# Patient Record
Sex: Male | Born: 1960
Health system: Southern US, Community
[De-identification: ages and names within clinical notes are randomized; demographics above are authoritative.]

## PROBLEM LIST (undated history)

## (undated) DIAGNOSIS — J449 Chronic obstructive pulmonary disease, unspecified: Secondary | ICD-10-CM

## (undated) DIAGNOSIS — M1990 Unspecified osteoarthritis, unspecified site: Secondary | ICD-10-CM

## (undated) DIAGNOSIS — F1010 Alcohol abuse, uncomplicated: Secondary | ICD-10-CM

## (undated) HISTORY — PX: OTHER SURGICAL HISTORY: SHX169

---

## 2019-02-01 ENCOUNTER — Emergency Department (HOSPITAL_COMMUNITY): Payer: Self-pay

## 2019-02-01 ENCOUNTER — Encounter (HOSPITAL_COMMUNITY): Payer: Self-pay | Admitting: Emergency Medicine

## 2019-02-01 ENCOUNTER — Inpatient Hospital Stay (HOSPITAL_COMMUNITY)
Admission: EM | Admit: 2019-02-01 | Discharge: 2019-02-04 | DRG: 200 | Disposition: A | Discharge status: Home / Self Care | Payer: Self-pay | Attending: General Surgery | Admitting: General Surgery

## 2019-02-01 ENCOUNTER — Other Ambulatory Visit: Payer: Self-pay

## 2019-02-01 DIAGNOSIS — M1990 Unspecified osteoarthritis, unspecified site: Secondary | ICD-10-CM | POA: Diagnosis present

## 2019-02-01 DIAGNOSIS — Y92524 Gas station as the place of occurrence of the external cause: Secondary | ICD-10-CM

## 2019-02-01 DIAGNOSIS — Z79899 Other long term (current) drug therapy: Secondary | ICD-10-CM

## 2019-02-01 DIAGNOSIS — J342 Deviated nasal septum: Secondary | ICD-10-CM | POA: Diagnosis present

## 2019-02-01 DIAGNOSIS — F10129 Alcohol abuse with intoxication, unspecified: Secondary | ICD-10-CM | POA: Diagnosis present

## 2019-02-01 DIAGNOSIS — Z1159 Encounter for screening for other viral diseases: Secondary | ICD-10-CM

## 2019-02-01 DIAGNOSIS — J90 Pleural effusion, not elsewhere classified: Secondary | ICD-10-CM | POA: Diagnosis present

## 2019-02-01 DIAGNOSIS — F17200 Nicotine dependence, unspecified, uncomplicated: Secondary | ICD-10-CM | POA: Diagnosis present

## 2019-02-01 DIAGNOSIS — S270XXA Traumatic pneumothorax, initial encounter: Principal | ICD-10-CM | POA: Diagnosis present

## 2019-02-01 DIAGNOSIS — J449 Chronic obstructive pulmonary disease, unspecified: Secondary | ICD-10-CM | POA: Diagnosis present

## 2019-02-01 DIAGNOSIS — S299XXA Unspecified injury of thorax, initial encounter: Secondary | ICD-10-CM | POA: Diagnosis present

## 2019-02-01 DIAGNOSIS — J939 Pneumothorax, unspecified: Secondary | ICD-10-CM

## 2019-02-01 DIAGNOSIS — Z23 Encounter for immunization: Secondary | ICD-10-CM

## 2019-02-01 DIAGNOSIS — S022XXA Fracture of nasal bones, initial encounter for closed fracture: Secondary | ICD-10-CM | POA: Diagnosis present

## 2019-02-01 DIAGNOSIS — Z791 Long term (current) use of non-steroidal anti-inflammatories (NSAID): Secondary | ICD-10-CM

## 2019-02-01 DIAGNOSIS — S2241XA Multiple fractures of ribs, right side, initial encounter for closed fracture: Secondary | ICD-10-CM | POA: Diagnosis present

## 2019-02-01 DIAGNOSIS — S060X9A Concussion with loss of consciousness of unspecified duration, initial encounter: Secondary | ICD-10-CM | POA: Diagnosis present

## 2019-02-01 DIAGNOSIS — S0083XA Contusion of other part of head, initial encounter: Secondary | ICD-10-CM | POA: Diagnosis present

## 2019-02-01 DIAGNOSIS — J9811 Atelectasis: Secondary | ICD-10-CM | POA: Diagnosis present

## 2019-02-01 DIAGNOSIS — Z7951 Long term (current) use of inhaled steroids: Secondary | ICD-10-CM

## 2019-02-01 DIAGNOSIS — Z590 Homelessness: Secondary | ICD-10-CM

## 2019-02-01 HISTORY — DX: Alcohol abuse, uncomplicated: F10.10

## 2019-02-01 HISTORY — DX: Chronic obstructive pulmonary disease, unspecified: J44.9

## 2019-02-01 HISTORY — DX: Unspecified osteoarthritis, unspecified site: M19.90

## 2019-02-01 LAB — CBC WITH DIFFERENTIAL/PLATELET
Abs Immature Granulocytes: 0.03 10*3/uL (ref 0.00–0.07)
Basophils Absolute: 0.1 10*3/uL (ref 0.0–0.1)
Basophils Relative: 1 %
Eosinophils Absolute: 0.1 10*3/uL (ref 0.0–0.5)
Eosinophils Relative: 2 %
HCT: 39.4 % (ref 39.0–52.0)
Hemoglobin: 13.6 g/dL (ref 13.0–17.0)
Immature Granulocytes: 0 %
Lymphocytes Relative: 17 %
Lymphs Abs: 1.6 10*3/uL (ref 0.7–4.0)
MCH: 34.1 pg — ABNORMAL HIGH (ref 26.0–34.0)
MCHC: 34.5 g/dL (ref 30.0–36.0)
MCV: 98.7 fL (ref 80.0–100.0)
Monocytes Absolute: 1.1 10*3/uL — ABNORMAL HIGH (ref 0.1–1.0)
Monocytes Relative: 12 %
Neutro Abs: 6.4 10*3/uL (ref 1.7–7.7)
Neutrophils Relative %: 68 %
Platelets: 447 10*3/uL — ABNORMAL HIGH (ref 150–400)
RBC: 3.99 MIL/uL — ABNORMAL LOW (ref 4.22–5.81)
RDW: 14.3 % (ref 11.5–15.5)
WBC: 9.4 10*3/uL (ref 4.0–10.5)
nRBC: 0 % (ref 0.0–0.2)

## 2019-02-01 LAB — COMPREHENSIVE METABOLIC PANEL
ALT: 47 U/L — ABNORMAL HIGH (ref 0–44)
AST: 64 U/L — ABNORMAL HIGH (ref 15–41)
Albumin: 3.8 g/dL (ref 3.5–5.0)
Alkaline Phosphatase: 53 U/L (ref 38–126)
Anion gap: 12 (ref 5–15)
BUN: 5 mg/dL — ABNORMAL LOW (ref 6–20)
CO2: 20 mmol/L — ABNORMAL LOW (ref 22–32)
Calcium: 8.5 mg/dL — ABNORMAL LOW (ref 8.9–10.3)
Chloride: 102 mmol/L (ref 98–111)
Creatinine, Ser: 0.66 mg/dL (ref 0.61–1.24)
GFR calc Af Amer: >60 mL/min (ref >60)
GFR calc non Af Amer: >60 mL/min (ref >60)
Glucose, Bld: 78 mg/dL (ref 70–99)
Potassium: 5.1 mmol/L (ref 3.5–5.1)
Sodium: 134 mmol/L — ABNORMAL LOW (ref 135–145)
Total Bilirubin: 1.1 mg/dL (ref 0.3–1.2)
Total Protein: 6.5 g/dL (ref 6.5–8.1)

## 2019-02-01 LAB — ETHANOL: Alcohol, Ethyl (B): 315 mg/dL (ref <10)

## 2019-02-01 LAB — SARS CORONAVIRUS 2 BY RT PCR (HOSPITAL ORDER, PERFORMED IN ~~LOC~~ HOSPITAL LAB): SARS Coronavirus 2: NEGATIVE

## 2019-02-01 MED ORDER — LORATADINE 10 MG PO TABS
10.0000 mg | ORAL_TABLET | Freq: Every day | ORAL | Status: DC
  Administered 2019-02-01 – 2019-02-04 (×4): 10 mg via ORAL
  Filled 2019-02-01 (×4): qty 1

## 2019-02-01 MED ORDER — SODIUM CHLORIDE 0.9 % IV BOLUS
1000.0000 mL | Freq: Once | INTRAVENOUS | Status: AC
  Administered 2019-02-01: 1000 mL via INTRAVENOUS

## 2019-02-01 MED ORDER — LORAZEPAM 1 MG PO TABS
0.0000 mg | ORAL_TABLET | Freq: Two times a day (BID) | ORAL | Status: DC
  Administered 2019-02-04: 2 mg via ORAL
  Filled 2019-02-01: qty 2

## 2019-02-01 MED ORDER — THIAMINE HCL 100 MG/ML IJ SOLN
100.0000 mg | Freq: Once | INTRAMUSCULAR | Status: AC
  Administered 2019-02-01: 13:00:00 100 mg via INTRAVENOUS
  Filled 2019-02-01: qty 2

## 2019-02-01 MED ORDER — DIPHENHYDRAMINE HCL 50 MG/ML IJ SOLN: 12.5000 mg | Freq: Four times a day (QID) | INTRAMUSCULAR | Status: DC | PRN

## 2019-02-01 MED ORDER — VITAMIN B-1 100 MG PO TABS
100.0000 mg | ORAL_TABLET | Freq: Every day | ORAL | Status: DC
  Administered 2019-02-02 – 2019-02-04 (×3): 100 mg via ORAL
  Filled 2019-02-01: qty 2
  Filled 2019-02-01 (×3): qty 1

## 2019-02-01 MED ORDER — IOHEXOL 300 MG/ML  SOLN
75.0000 mL | Freq: Once | INTRAMUSCULAR | Status: AC | PRN
  Administered 2019-02-01: 15:00:00 75 mL via INTRAVENOUS

## 2019-02-01 MED ORDER — AEROCHAMBER PLUS FLO-VU MEDIUM MISC
1.0000 | Freq: Once | Status: AC
  Administered 2019-02-01: 17:00:00 1
  Filled 2019-02-01: qty 1

## 2019-02-01 MED ORDER — ACETAMINOPHEN 325 MG PO TABS
650.0000 mg | ORAL_TABLET | ORAL | Status: DC | PRN
  Administered 2019-02-02: 650 mg via ORAL
  Filled 2019-02-01: qty 2

## 2019-02-01 MED ORDER — ONDANSETRON 4 MG PO TBDP: 4.0000 mg | ORAL_TABLET | Freq: Four times a day (QID) | ORAL | Status: DC | PRN

## 2019-02-01 MED ORDER — TETANUS-DIPHTH-ACELL PERTUSSIS 5-2.5-18.5 LF-MCG/0.5 IM SUSP
0.5000 mL | Freq: Once | INTRAMUSCULAR | Status: AC
  Administered 2019-02-01: 13:00:00 0.5 mL via INTRAMUSCULAR
  Filled 2019-02-01: qty 0.5

## 2019-02-01 MED ORDER — QUETIAPINE FUMARATE 100 MG PO TABS
100.0000 mg | ORAL_TABLET | Freq: Every day | ORAL | Status: DC
  Administered 2019-02-01 – 2019-02-03 (×3): 100 mg via ORAL
  Filled 2019-02-01 (×3): qty 1

## 2019-02-01 MED ORDER — MELOXICAM 7.5 MG PO TABS
15.0000 mg | ORAL_TABLET | Freq: Every day | ORAL | Status: DC
  Administered 2019-02-01 – 2019-02-04 (×4): 15 mg via ORAL
  Filled 2019-02-01 (×4): qty 2

## 2019-02-01 MED ORDER — ALBUTEROL SULFATE HFA 108 (90 BASE) MCG/ACT IN AERS
10.0000 | INHALATION_SPRAY | Freq: Once | RESPIRATORY_TRACT | Status: AC
  Administered 2019-02-01: 13:00:00 10 via RESPIRATORY_TRACT
  Filled 2019-02-01: qty 6.7

## 2019-02-01 MED ORDER — ENOXAPARIN SODIUM 40 MG/0.4ML ~~LOC~~ SOLN
40.0000 mg | SUBCUTANEOUS | Status: DC
  Administered 2019-02-03: 08:00:00 40 mg via SUBCUTANEOUS
  Filled 2019-02-01 (×3): qty 0.4

## 2019-02-01 MED ORDER — LORAZEPAM 2 MG/ML IJ SOLN
0.0000 mg | Freq: Four times a day (QID) | INTRAMUSCULAR | Status: AC
  Administered 2019-02-02 (×2): 2 mg via INTRAVENOUS
  Filled 2019-02-01 (×2): qty 1

## 2019-02-01 MED ORDER — ADULT MULTIVITAMIN W/MINERALS CH
1.0000 | ORAL_TABLET | Freq: Every day | ORAL | Status: DC
  Administered 2019-02-01 – 2019-02-04 (×4): 1 via ORAL
  Filled 2019-02-01 (×4): qty 1

## 2019-02-01 MED ORDER — FOLIC ACID 1 MG PO TABS
1.0000 mg | ORAL_TABLET | Freq: Every day | ORAL | Status: DC
  Administered 2019-02-01 – 2019-02-04 (×4): 1 mg via ORAL
  Filled 2019-02-01 (×4): qty 1

## 2019-02-01 MED ORDER — DOCUSATE SODIUM 100 MG PO CAPS
100.0000 mg | ORAL_CAPSULE | Freq: Two times a day (BID) | ORAL | Status: DC
  Administered 2019-02-01 – 2019-02-03 (×5): 100 mg via ORAL
  Filled 2019-02-01 (×6): qty 1

## 2019-02-01 MED ORDER — ONDANSETRON HCL 4 MG/2ML IJ SOLN: 4.0000 mg | Freq: Four times a day (QID) | INTRAMUSCULAR | Status: DC | PRN

## 2019-02-01 MED ORDER — MORPHINE SULFATE (PF) 2 MG/ML IV SOLN: 0.5000 mg | INTRAVENOUS | Status: DC | PRN

## 2019-02-01 MED ORDER — MOMETASONE FURO-FORMOTEROL FUM 200-5 MCG/ACT IN AERO
2.0000 | INHALATION_SPRAY | Freq: Two times a day (BID) | RESPIRATORY_TRACT | Status: DC
  Administered 2019-02-02 – 2019-02-04 (×5): 2 via RESPIRATORY_TRACT
  Filled 2019-02-01: qty 8.8

## 2019-02-01 MED ORDER — METOPROLOL TARTRATE 5 MG/5ML IV SOLN: 5.0000 mg | Freq: Four times a day (QID) | INTRAVENOUS | Status: DC | PRN

## 2019-02-01 MED ORDER — QUETIAPINE FUMARATE 50 MG PO TABS
50.0000 mg | ORAL_TABLET | Freq: Every day | ORAL | Status: DC
  Administered 2019-02-02 – 2019-02-04 (×3): 50 mg via ORAL
  Filled 2019-02-01 (×3): qty 1

## 2019-02-01 MED ORDER — OXYCODONE HCL 5 MG PO TABS
5.0000 mg | ORAL_TABLET | Freq: Once | ORAL | Status: AC
  Administered 2019-02-01: 17:00:00 5 mg via ORAL
  Filled 2019-02-01: qty 1

## 2019-02-01 MED ORDER — SODIUM CHLORIDE 0.9 % IV SOLN
INTRAVENOUS | Status: AC
  Administered 2019-02-01: 21:00:00 via INTRAVENOUS

## 2019-02-01 MED ORDER — LORAZEPAM 1 MG PO TABS
0.0000 mg | ORAL_TABLET | Freq: Four times a day (QID) | ORAL | Status: AC
  Administered 2019-02-01 – 2019-02-03 (×4): 2 mg via ORAL
  Administered 2019-02-03: 06:00:00 1 mg via ORAL
  Filled 2019-02-01: qty 2
  Filled 2019-02-01: qty 1
  Filled 2019-02-01 (×3): qty 2

## 2019-02-01 MED ORDER — IPRATROPIUM BROMIDE HFA 17 MCG/ACT IN AERS
4.0000 | INHALATION_SPRAY | Freq: Once | RESPIRATORY_TRACT | Status: AC
  Administered 2019-02-01: 17:00:00 4 via RESPIRATORY_TRACT
  Filled 2019-02-01: qty 12.9

## 2019-02-01 MED ORDER — OXYCODONE HCL 5 MG PO TABS
5.0000 mg | ORAL_TABLET | ORAL | Status: DC | PRN
  Administered 2019-02-01: 21:00:00 10 mg via ORAL
  Administered 2019-02-02 (×2): 5 mg via ORAL
  Administered 2019-02-02 – 2019-02-03 (×3): 10 mg via ORAL
  Administered 2019-02-04: 05:00:00 5 mg via ORAL
  Filled 2019-02-01 (×3): qty 2
  Filled 2019-02-01: qty 1
  Filled 2019-02-01: qty 2
  Filled 2019-02-01: qty 1
  Filled 2019-02-01: qty 2

## 2019-02-01 MED ORDER — LORAZEPAM 2 MG/ML IJ SOLN: 0.0000 mg | Freq: Two times a day (BID) | INTRAMUSCULAR | Status: DC

## 2019-02-01 MED ORDER — PANTOPRAZOLE SODIUM 40 MG PO TBEC
40.0000 mg | DELAYED_RELEASE_TABLET | Freq: Every day | ORAL | Status: DC
  Administered 2019-02-01 – 2019-02-04 (×4): 40 mg via ORAL
  Filled 2019-02-01 (×4): qty 1

## 2019-02-01 MED ORDER — FOLIC ACID 1 MG PO TABS
1.0000 mg | ORAL_TABLET | Freq: Once | ORAL | Status: AC
  Administered 2019-02-01: 13:00:00 1 mg via ORAL
  Filled 2019-02-01: qty 1

## 2019-02-01 NOTE — Progress Notes (Signed)
CSW received consult for patient to assist with his homelessness. CSW reviewed chart, patient is currently under the influence of alcohol, his alcohol level was 315 two hours ago. CSW will continue following chart and will meet with patient when appropriate.  Madilyn Fireman, MSW, LCSW-A Clinical Social Worker [facility] [phone]

## 2019-02-01 NOTE — Progress Notes (Signed)
Received pt from ED on stretcher w/ER RN. Pt is c/o 8 out of 10 pain mostly in his ribs. VSS. Pt has multiple abrasion sites and bruiising and swelling to his R orbital. Pt is A&Ox4. Sacral foam applied to pt. Provided teaching on SCDs, skin protection, calling nurse to get OOB, and oriented to unit. Skin assessment perfromed w/RN [place]. Pt has no further questions at this time.
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
## 2019-02-01 NOTE — ED Notes (Signed)
EDP updated on critical lab.

## 2019-02-01 NOTE — ED Notes (Signed)
Patient transported to CT 
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
## 2019-02-02 ENCOUNTER — Inpatient Hospital Stay (HOSPITAL_COMMUNITY): Payer: Self-pay

## 2019-02-02 ENCOUNTER — Encounter (HOSPITAL_COMMUNITY): Payer: Self-pay

## 2019-02-02 ENCOUNTER — Other Ambulatory Visit: Payer: Self-pay

## 2019-02-02 LAB — BASIC METABOLIC PANEL
Anion gap: 11 (ref 5–15)
BUN: <5 mg/dL — ABNORMAL LOW (ref 6–20)
CO2: 22 mmol/L (ref 22–32)
Calcium: 8.5 mg/dL — ABNORMAL LOW (ref 8.9–10.3)
Chloride: 106 mmol/L (ref 98–111)
Creatinine, Ser: 0.7 mg/dL (ref 0.61–1.24)
GFR calc Af Amer: >60 mL/min (ref >60)
GFR calc non Af Amer: >60 mL/min (ref >60)
Glucose, Bld: 68 mg/dL — ABNORMAL LOW (ref 70–99)
Potassium: 4 mmol/L (ref 3.5–5.1)
Sodium: 139 mmol/L (ref 135–145)

## 2019-02-02 LAB — CBC
HCT: 35.8 % — ABNORMAL LOW (ref 39.0–52.0)
Hemoglobin: 12.4 g/dL — ABNORMAL LOW (ref 13.0–17.0)
MCH: 34.3 pg — ABNORMAL HIGH (ref 26.0–34.0)
MCHC: 34.6 g/dL (ref 30.0–36.0)
MCV: 98.9 fL (ref 80.0–100.0)
Platelets: 388 10*3/uL (ref 150–400)
RBC: 3.62 MIL/uL — ABNORMAL LOW (ref 4.22–5.81)
RDW: 14.5 % (ref 11.5–15.5)
WBC: 4.9 10*3/uL (ref 4.0–10.5)
nRBC: 0 % (ref 0.0–0.2)

## 2019-02-02 NOTE — Progress Notes (Signed)
Pt states that he cannot take "a lot of pain medicine" because he is an alcoholic and he doesn't want to be "addicted to heroine too". He stated that he wanted the dose of Oxy now, but he didn't want much more. Informed him that we would pass that on to the doctors and nurses on days as well.
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
## 2019-02-03 NOTE — Progress Notes (Signed)
CSW met with patient for SBIRT and SA assessment. CSW spoke with patient regarding alcohol use and noted his report of drinking a 12 pack or more daily for the past three months after leaving AMA from a [place] facility in [place], [place]. CSW noted patient reported heavy drinking since 58 y/o and having several inpatient detoxification and residential inpatient stays. CSW utilized motivational interviewing to discuss inpatient substance use. CSW noted patient wants to return to [place]. CSW provided psychoeducation and resources to support inpatient at possibly [place] [facility].  Lamonte Richer, LCSW, [place] Worker II [phone]

## 2019-02-03 NOTE — Progress Notes (Signed)
Subjective: No issues overnight. Resting comfortably in bed.  Objective: Vital signs in last 24 hours: Temp:  [98.1 F (36.7 C)-98.8 F (37.1 C)] 98.1 F (36.7 C) (07/05 0802) Pulse Rate:  [61-83] 70 (07/05 0802) Resp:  [13-19] 13 (07/05 0802) BP: (107-136)/(63-88) 124/82 (07/05 0802) SpO2:  [92 %-97 %] 96 % (07/05 0802)  Physical Exam Constitutional: He isoriented to person, place, and time. He appearswell-developedand well-nourished.  Head:Normocephalic.  Right Ear: External earnormal.  Left Ear: External earnormal.  Eyes:Pupils are equal, round, and reactive to light.Conjunctivaeand EOMare normal.  Nose: Mild dorsal deviation to the right. Severe NSD. No mass or lesion. Mouth: Normal mucosa. Neck:Normal range of motion.Neck supple. No tracheal deviationpresent. No thyromegalypresent.  Cardiovascular:Normal rate,regular rhythmand intact distal pulses.  Respiratory:Effort normal. Norespiratory distress.  Skin: Skin iswarmand dry.No rashnoted. He is not diaphoretic. Noerythema. Nopallor.    Recent Labs    02/01/19 1201 02/02/19 0154  WBC 9.4 4.9  HGB 13.6 12.4*  HCT 39.4 35.8*  PLT 447* 388   Recent Labs    02/01/19 1201 02/02/19 0154  NA 134* 139  K 5.1 4.0  CL 102 106  CO2 20* 22  GLUCOSE 78 68*  BUN 5* <5*  CREATININE 0.66 0.70  CALCIUM 8.5* 8.5*    Medications:  I have reviewed the patient's current medications. Scheduled: . docusate sodium  100 mg Oral BID  . enoxaparin (LOVENOX) injection  40 mg Subcutaneous Q24H  . folic acid  1 mg Oral Daily  . loratadine  10 mg Oral Daily  . LORazepam  0-4 mg Intravenous Q6H   Or  . LORazepam  0-4 mg Oral Q6H  . [START ON 02/04/2019] LORazepam  0-4 mg Intravenous Q12H   Or  . [START ON 02/04/2019] LORazepam  0-4 mg Oral Q12H  . meloxicam  15 mg Oral Daily  . mometasone-formoterol  2 puff Inhalation BID  . multivitamin with minerals  1 tablet Oral Daily  . pantoprazole  40 mg Oral  Daily  . QUEtiapine  100 mg Oral QHS  . QUEtiapine  50 mg Oral Daily  . thiamine  100 mg Oral Daily   Continuous:   Assessment/Plan: Bilateral nasal fractures with mild dorsal deviation to the right. - History of severe nasal septal deviation. - Conservative observation. - Patient may follow up with me as an outpatient.    LOS: 2 days   Dainelle Hun W Eshika Reckart 02/03/2019, 11:51 AM
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
## 2019-02-04 LAB — HIV ANTIBODY (ROUTINE TESTING W REFLEX): HIV Screen 4th Generation wRfx: NONREACTIVE

## 2019-02-04 MED ORDER — OXYCODONE HCL 5 MG PO TABS: 5.0000 mg | ORAL_TABLET | ORAL | 0 refills | Status: AC | PRN

## 2019-02-04 MED ORDER — ACETAMINOPHEN 325 MG PO TABS: 650.0000 mg | ORAL_TABLET | ORAL | Status: AC | PRN

## 2019-02-04 MED FILL — oxyCODONE HCL 5 MG TABS: 5 | 3 days supply | Qty: 30 | Fill #0

## 2019-02-04 NOTE — Discharge Instructions (Signed)
Rib Fracture  A rib fracture is a break or crack in one of the bones of the ribs. The ribs are like a cage that goes around your upper chest. A broken or cracked rib is often painful, but most do not cause other problems. Most rib fractures usually heal on their own in 1-3 months. Follow these instructions at home: Managing pain, stiffness, and swelling  If directed, apply ice to the injured area. ? Put ice in a plastic bag. ? Place a towel between your skin and the bag. ? Leave the ice on for 20 minutes, 2-3 times a day.  Take over-the-counter and prescription medicines only as told by your doctor. Activity  Avoid activities that cause pain to the injured area. Protect your injured area.  Slowly increase activity as told by your doctor. General instructions  Do deep breathing as told by your doctor. You may be told to: ? Take deep breaths many times a day. ? Cough many times a day while hugging a pillow. ? Use a device (incentive spirometer) to do deep breathing many times a day.  Drink enough fluid to keep your pee (urine) clear or pale yellow.  Do not wear a rib belt or binder. These do not allow you to breathe deeply.  Keep all follow-up visits as told by your doctor. This is important. Contact a doctor if:  You have a fever. Get help right away if:  You have trouble breathing.  You are short of breath.  You cannot stop coughing.  You cough up thick or bloody spit (sputum).  You feel sick to your stomach (nauseous), throw up (vomit), or have belly (abdominal) pain.  Your pain gets worse and medicine does not help. Summary  A rib fracture is a break or crack in one of the bones of the ribs.  Apply ice to the injured area and take medicines for pain as told by your doctor.  Take deep breaths and cough many times a day. Hug a pillow every time you cough. This information is not intended to replace advice given to you by your health care provider. Make sure you  discuss any questions you have with your health care provider. Document Released: 04/26/2008 Document Revised: 06/30/2017 Document Reviewed: 10/18/2016 Elsevier Patient Education  2020 [place].   Nasal Fracture A fracture is a break in a bone. A nasal fracture is a broken nose. Minor breaks do not need treatment. They often heal on their own in about one month. Serious breaks may need treatment. Sometimes surgery is needed. What are the causes? This condition is usually caused by a direct hit to the nose (blunt injury). This often occurs from:  Playing a contact sport.  Being in a car accident.  Falling.  Getting punched. What are the signs or symptoms?  Pain.  Swelling of the nose.  Bleeding from the nose.  Bruising around the nose or bruising around the eyes (black eyes).  The nose having a crooked shape. How is this treated? Treatment depends on how bad the injury is.  Minor breaks often do not need treatment.  For more serious breaks that have caused bones to move out of position, treatment may involve one of these: ? Moving the bones back into position without surgery. Your doctor may be able to do this in his or her office after you are given medicine to numb the nose area (local anesthetic). ? Surgery. If needed, this will be done after the swelling is  gone. Follow these instructions at home: Activity  Return to your normal activities as told by your doctor. Ask your doctor what activities are safe for you.  Do not play contact sports for 3-4 weeks or as told by your doctor. General instructions      If told, put ice on the injured area: ? Put ice in a plastic bag. ? Place a towel between your skin and the bag. ? Leave the ice on for 20 minutes, 2-3 times a day.  Take over-the-counter and prescription medicines only as told by your doctor.  If your nose bleeds, sit up while you gently squeeze your nose shut for 10 minutes.  Try to not blow your  nose.  Keep all follow-up visits as told by your doctor. This is important. Contact a doctor if:  You have more pain or very bad pain.  You keep having nosebleeds.  The shape of your nose does not return to normal after 5 days.  You have pus coming out of your nose. Get help right away if:  Your nose bleeds for more than 20 minutes.  You have clear fluid draining out of your nose.  You have a swelling on the inside of your nose that does not get better.  You have trouble moving your eyes.  You keep throwing up (vomiting). Summary  A nasal fracture is a broken nose.  Symptoms include pain, swelling, and bruising.  Minor breaks often do not require treatment. More serious breaks may require surgery or other treatments.  If your nose bleeds, sit up while you gently squeeze your nose shut for 10 minutes. This information is not intended to replace advice given to you by your health care provider. Make sure you discuss any questions you have with your health care provider. Document Released: 04/26/2008 Document Revised: 12/19/2017 Document Reviewed: 12/19/2017 Elsevier Patient Education  2020 [employer]
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
## 2020-12-18 IMAGING — CT CT MAXILLOFACIAL WITHOUT CONTRAST
4 of 10 series · 16 of 47 positions shown, 18 images · non-contrast
Comparison: None.

CLINICAL DATA: Minor head trauma.

EXAM:
CT HEAD WITHOUT CONTRAST
CT MAXILLOFACIAL WITHOUT CONTRAST
CT CERVICAL SPINE WITHOUT CONTRAST
TECHNIQUE: Multidetector CT imaging of the head, cervical spine, and
maxillofacial structures were performed using the standard protocol
without intravenous contrast. Multiplanar CT image reconstructions
of the cervical spine and maxillofacial structures were also
generated.

[Series 6: head 3.0 mpr cor · coronal · 0.33mm/px · 1 of 66 slices shown]
[im 33/66  bone]
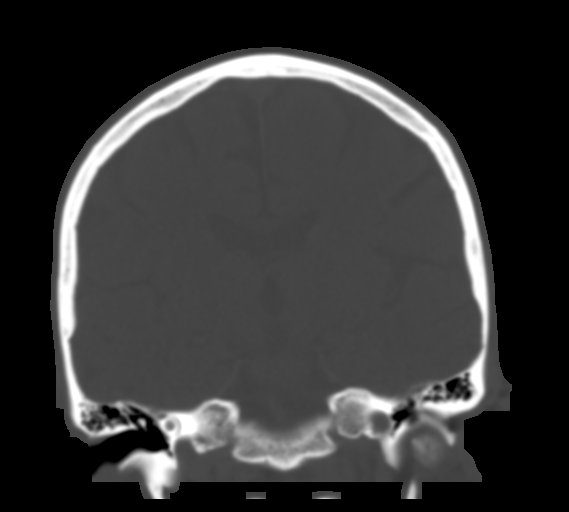

[Series 8: facial/ orbits 2.0 h30s · axial · 0.37mm/px · z∈[-150,-32]mm · 6 of 83 slices shown]
[im 12/83  bone]
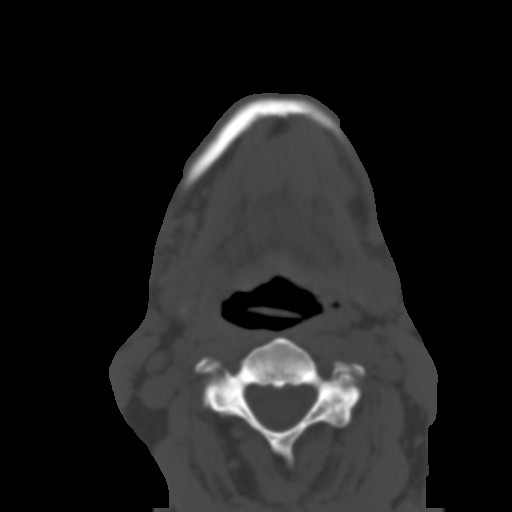
[im 24/83  bone]
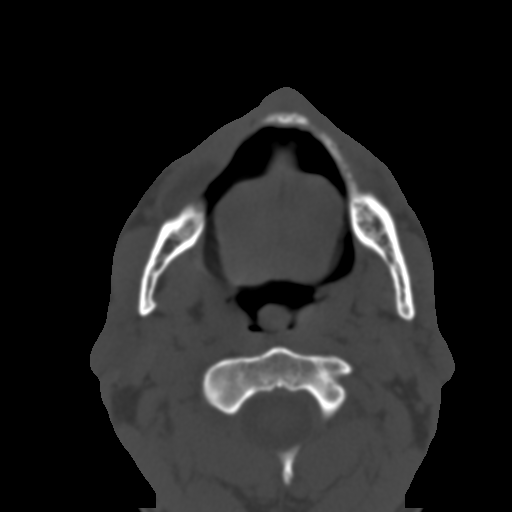
[im 36/83  bone]
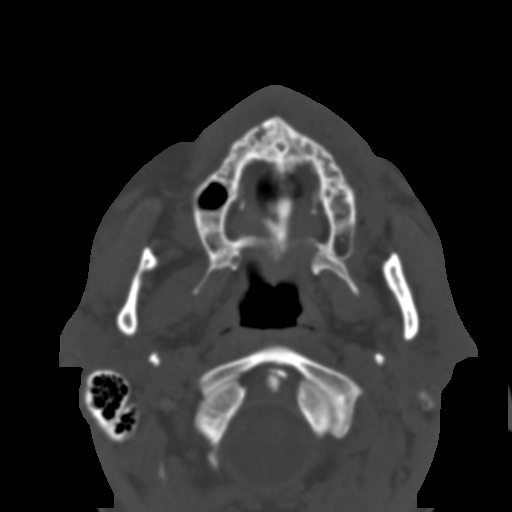
[im 47/83  bone]
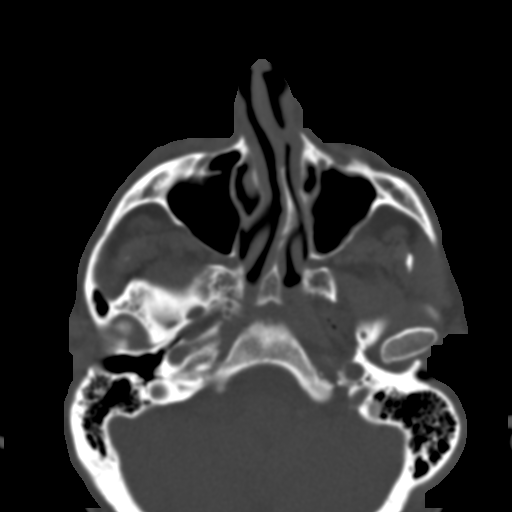
[im 59/83  bone]
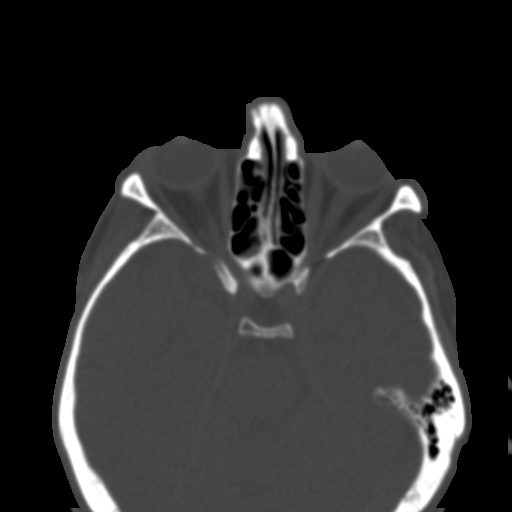
[im 71/83  bone]
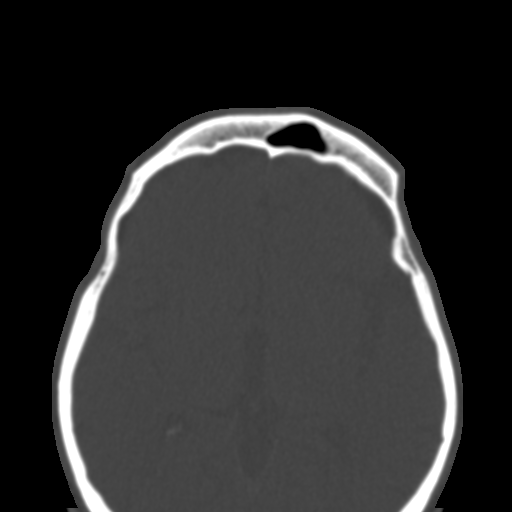

[Series 13: sagittal soft tissue · sagittal · 0.35mm/px · 1 of 85 slices shown]
[im 43/85  bone]
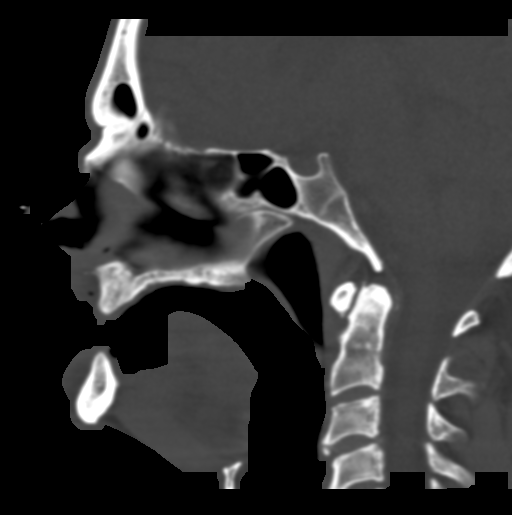

[Series 17: c_spine 2.0 3 st · axial · 0.29mm/px · z∈[-242,-82]mm · 8 of 104 slices shown, 10 images]
[im 12/104  brain]
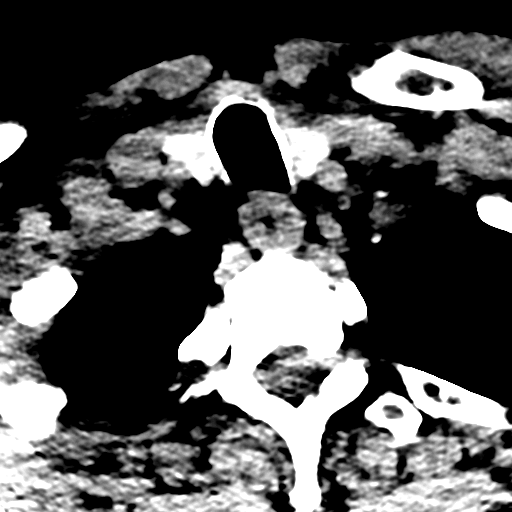
[im 12/104  bone]
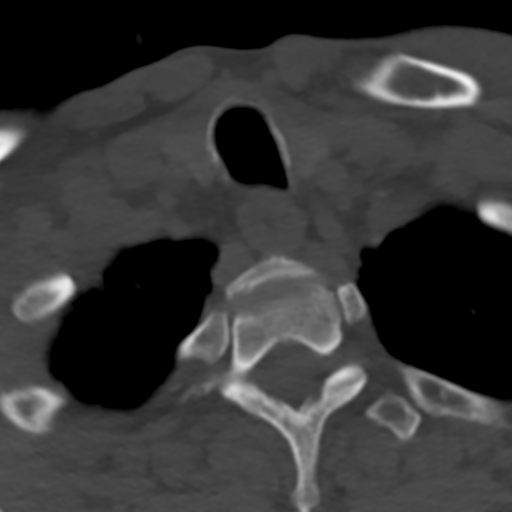
[im 23/104  bone]
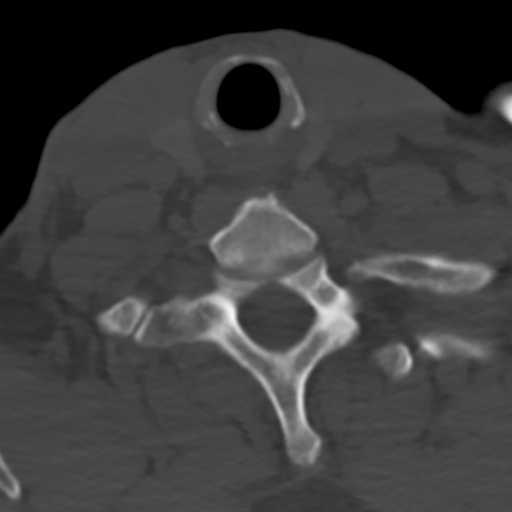
[im 35/104  bone]
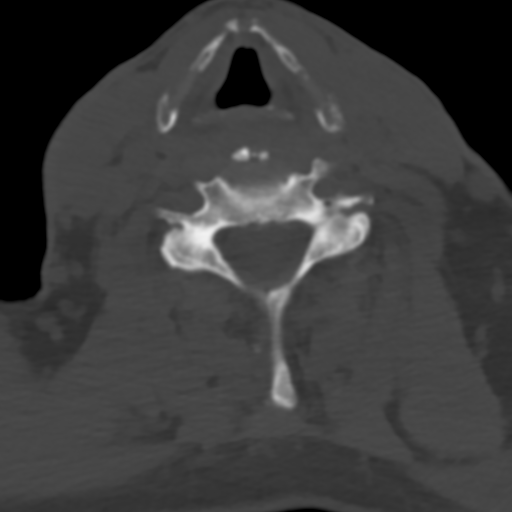
[im 46/104  bone]
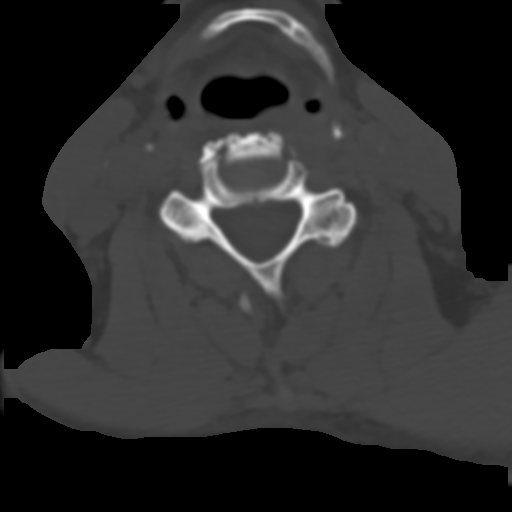
[im 58/104  brain]
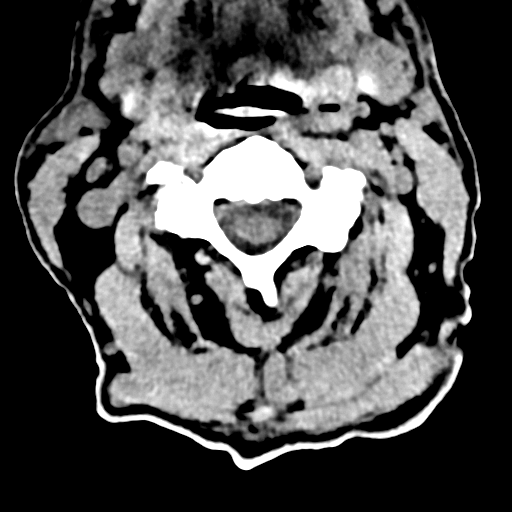
[im 58/104  bone]
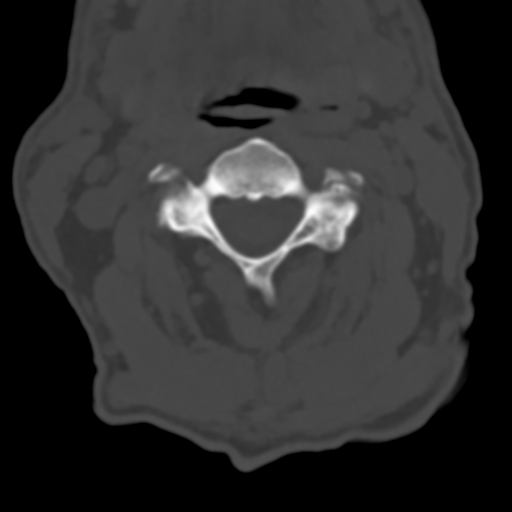
[im 69/104  bone]
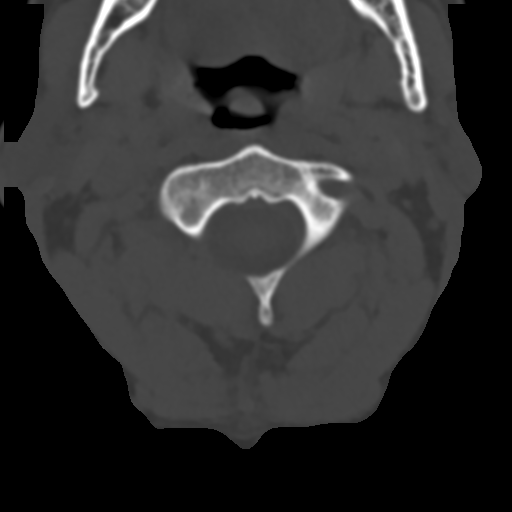
[im 81/104  bone]
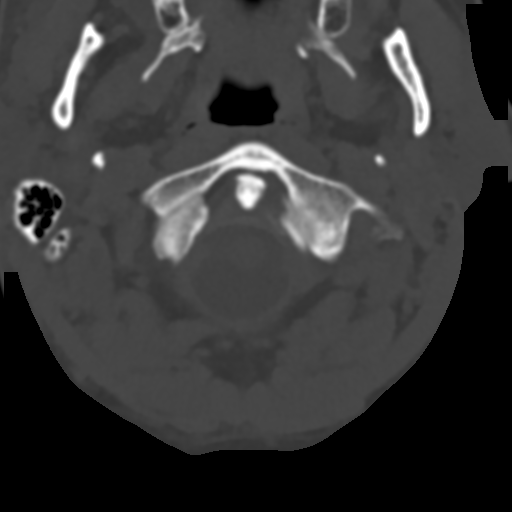
[im 92/104  bone]
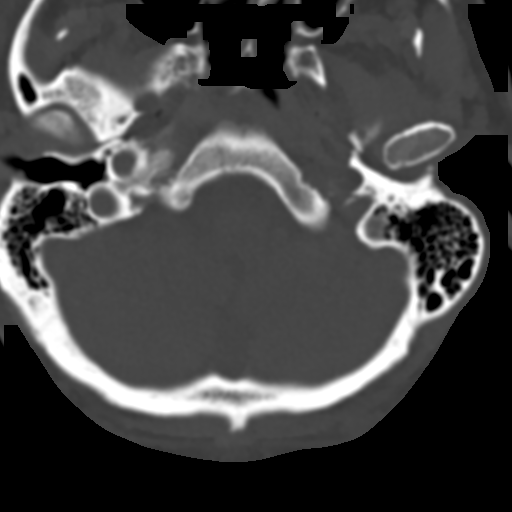

[16 of 47 positions shown; findings below may reference images not displayed]

FINDINGS: CT HEAD FINDINGS

Brain: No evidence of acute infarction, hemorrhage, hydrocephalus,
extra-axial collection or mass lesion/mass effect. There is mild
chronic diffuse atrophy.

Vascular: No hyperdense vessel is noted.

Skull: Normal. Negative for fracture or focal lesion.

Other: None.

CT MAXILLOFACIAL FINDINGS

Osseous: Comminuted displaced fractures of the nasal bones
identified. There is nasal septum deviation from left to right
without fracture noted.

Orbits: Negative. No traumatic or inflammatory finding.

Sinuses: Mucoperiosteal thickening of the left maxillary sinus is
noted.

Soft tissues: There is soft tissue swelling overlying the right
maxilla.

CT CERVICAL SPINE FINDINGS

Alignment: Normal.

Skull base and vertebrae: No acute fracture. No primary bone lesion
or focal pathologic process.

Soft tissues and spinal canal: No prevertebral fluid or swelling. No
visible canal hematoma.

Disc levels: There are degenerative joint changes with anterior
osteophytosis C3, C4, C5 and C6. facet joint sclerosis is identified
throughout the upper to mid cervical spine. The intervertebral
spaces are normal.

Upper chest: Negative.

Other: None.
IMPRESSION: No focal acute intracranial abnormality identified.

Comminuted displaced fractures of the nasal bones.

Soft tissue swelling overlying the right maxilla.

No acute fracture or dislocation of cervical spine.
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
